# Patient Record
Sex: Female | Born: 1960 | Race: Black or African American | Hispanic: No | Marital: Married | Smoking: Never smoker
Health system: Southern US, Community
[De-identification: ages and names within clinical notes are randomized; demographics above are authoritative.]

## PROBLEM LIST (undated history)

## (undated) DIAGNOSIS — I10 Essential (primary) hypertension: Secondary | ICD-10-CM

---

## 2016-10-26 ENCOUNTER — Emergency Department
Admission: EM | Admit: 2016-10-26 | Discharge: 2016-10-26 | Disposition: A | Discharge status: Home / Self Care | Payer: Worker's Compensation | Attending: Emergency Medicine | Admitting: Emergency Medicine

## 2016-10-26 ENCOUNTER — Encounter: Payer: Self-pay | Admitting: Emergency Medicine

## 2016-10-26 DIAGNOSIS — S0993XA Unspecified injury of face, initial encounter: Secondary | ICD-10-CM | POA: Diagnosis present

## 2016-10-26 DIAGNOSIS — Y929 Unspecified place or not applicable: Secondary | ICD-10-CM | POA: Diagnosis not present

## 2016-10-26 DIAGNOSIS — S0083XA Contusion of other part of head, initial encounter: Secondary | ICD-10-CM | POA: Diagnosis not present

## 2016-10-26 DIAGNOSIS — Y939 Activity, unspecified: Secondary | ICD-10-CM | POA: Insufficient documentation

## 2016-10-26 DIAGNOSIS — W228XXA Striking against or struck by other objects, initial encounter: Secondary | ICD-10-CM | POA: Insufficient documentation

## 2016-10-26 DIAGNOSIS — Y990 Civilian activity done for income or pay: Secondary | ICD-10-CM | POA: Diagnosis not present

## 2016-10-26 MED ORDER — MELOXICAM 15 MG PO TABS: 15.0000 mg | ORAL_TABLET | Freq: Every day | ORAL | 0 refills | Status: DC

## 2016-10-26 NOTE — ED Provider Notes (Signed)
[facility] Emergency Department Provider Note  ____________________________________________  Time seen: Approximately 4:56 PM  I have reviewed the triage vital signs and the nursing notes.   HISTORY  Chief Complaint Facial Injury    HPI Chelsea Marshall is a 56 y.o. female who presents to emergency department complaining of right eye pain. Patient was at work when a cardboard 2 hit her in the right side of face. Patient reports that she initially had a "bump" to the lateral aspect of the right eye. She reports that this has gone down but she is starting to have some dark coloration underneath her right eye. She denies any pain at this time. She reports that she does wear glasses and they were knocked off of her face. She denies any blurred vision, double vision, visual changes, foreign body sensation to the eye. No headache, neck pain, chest pain, shortness breath, nausea vomiting. No medications prior to arrival.  Patient was sent by worker's comp to urgent care. Patient reports that after dealing with injury, all the paperwork, daily with boxes, but wait time in urgent care for blood pressure was elevated. She has no underlying history of hypertension. Urgent care sent patient over due to elevated blood pressure. Patient is visibly upset, worked up from her ordeal today. She denies any headache, visual changes, chest pain, shortness of breath.   History reviewed. No pertinent past medical history.  There are no active problems to display for this patient.   History reviewed. No pertinent surgical history.  Prior to Admission medications   Medication Sig Start Date End Date Taking? Authorizing Provider  meloxicam (MOBIC) 15 MG tablet Take 1 tablet (15 mg total) by mouth daily. 10/26/16   Hogan Hoobler, Delorise Royals, PA-C    Allergies Penicillins  History reviewed. No pertinent family history.  Social History Social History  Substance Use Topics  . Smoking  status: Never Smoker  . Smokeless tobacco: Never Used  . Alcohol use No     Review of Systems  Constitutional: No fever/chills Eyes: No visual changes. No foreign body sensation. Patient does wear glasses. ENT: No upper respiratory complaints. Cardiovascular: no chest pain. Respiratory: no cough. No SOB. Gastrointestinal: No abdominal pain.  No nausea, no vomiting.   Musculoskeletal: Negative for musculoskeletal pain. Skin: Negative for rash, abrasions, lacerations, ecchymosis. Neurological: Negative for headaches, focal weakness or numbness. 10-point ROS otherwise negative.  ____________________________________________   PHYSICAL EXAM:  VITAL SIGNS: ED Triage Vitals  Enc Vitals Group     BP 10/26/16 1607 (!) 197/103     Pulse Rate 10/26/16 1602 (!) 57     Resp 10/26/16 1602 16     Temp 10/26/16 1602 98.4 F (36.9 C)     Temp Source 10/26/16 1602 Oral     SpO2 10/26/16 1602 100 %     Weight 10/26/16 1602 158 lb (71.7 kg)     Height 10/26/16 1602 5\' 6"  (1.676 m)     Head Circumference --      Peak Flow --      Pain Score 10/26/16 1602 7     Pain Loc --      Pain Edu? --      Excl. in GC? --      Constitutional: Alert and oriented. Well appearing and in no acute distress. Eyes: Conjunctivae are normal. PERRL. EOMI. Head:small hematoma noted lateral to the right eye. This is mildly tender to palpation. No tenderness to palpation of the osseous structures of the orbits  or face. No palpable abnormality. No subcutaneous emphysema. Patient does have mild ecchymosis under the right eye. Extraocular motions intact. No serosanguineous fluid drainage from the ears or nares. No battle signs. ENT:      Ears:       Nose: No congestion/rhinnorhea.      Mouth/Throat: Mucous membranes are moist.  Neck: No stridor.  No cervical spine tenderness to palpation.  Cardiovascular: Normal rate, regular rhythm. Normal S1 and S2.  Good peripheral circulation. Respiratory: Normal respiratory  effort without tachypnea or retractions. Lungs CTAB. Good air entry to the bases with no decreased or absent breath sounds. Musculoskeletal: Full range of motion to all extremities. No gross deformities appreciated. Neurologic:  Normal speech and language. No gross focal neurologic deficits are appreciated.  Skin:  Skin is warm, dry and intact. No rash noted. Psychiatric: Mood and affect are normal. Speech and behavior are normal. Patient exhibits appropriate insight and judgement.   ____________________________________________   LABS (all labs ordered are listed, but only abnormal results are displayed)  Labs Reviewed - No data to display ____________________________________________  EKG   ____________________________________________  RADIOLOGY   No results found.  ____________________________________________    PROCEDURES  Procedure(s) performed:    Procedures    Medications - No data to display   ____________________________________________   INITIAL IMPRESSION / ASSESSMENT AND PLAN / ED COURSE  Pertinent labs & imaging results that were available during my care of the patient were reviewed by me and considered in my medical decision making (see chart for details).  Review of the [place] CSRS was performed in accordance of the [facility] prior to dispensing any controlled drugs.     Patient's diagnosis is consistent with facial contusion. At this time, exam was reassuring with no indication of underlying fractures or other somatic etiology. At this time, imaging is not warranted. She did have an elevated blood pressure and was sent from urgent care to the emergency department for evaluation of both injury as well as elevated blood pressure. Patient reports being extremely stressed from the injury, paperwork, dealing was appears at work. Patient was allowed to calm down, with reassuring diagnosis patient's vital signs did improve. No underlying history of hypertension. No  concerning symptoms warranting further investigation at this time. I will not start hypertensive medications. Patient is advised to follow-up with primary care and if she continues to have elevated blood pressures above normal limits, she will discuss with primary care potential for hypertension medications. Patient will be discharged home with prescriptions for anti-inflammatories for symptom control. Patient is to follow up with primary care as needed or otherwise directed. Patient is given ED precautions to return to the ED for any worsening or new symptoms.     ____________________________________________  FINAL CLINICAL IMPRESSION(S) / ED DIAGNOSES  Final diagnoses:  Contusion of face, initial encounter      NEW MEDICATIONS STARTED DURING THIS VISIT:  New Prescriptions   MELOXICAM (MOBIC) 15 MG TABLET    Take 1 tablet (15 mg total) by mouth daily.        This chart was dictated using voice recognition software/Dragon. Despite best efforts to proofread, errors can occur which can change the meaning. Any change was purely unintentional.    Racheal PatchesCuthriell, Lekesha Claw D, PA-C 10/26/16 1734    Phineas SemenGoodman, Graydon, MD 10/26/16 [phone]

## 2016-10-26 NOTE — ED Notes (Signed)

## 2016-10-26 NOTE — ED Notes (Signed)
Pt's BP discussed with Provider; provider said to provide instruction to pt to follow up with primary care provider regarding HTN; pt provided instructions to follow up with PCP; pt verbalized understanding.

## 2016-10-26 NOTE — ED Notes (Signed)
See triage note  Hit the right side of head at work  Small knot to right of head noted   No loc

## 2016-10-26 NOTE — ED Triage Notes (Signed)
Pt hit in side of head with cardboard tube. No LOC. Did not hit eye. This is worker comp; [employer].  Small knot to right side of face.  BP up in triage but comes down each time taken; pt agitated and upset about incident.  Did not want to come to ED.

## 2016-10-26 NOTE — ED Notes (Signed)
Urine drug screen performed. No issues.  

## 2016-11-22 ENCOUNTER — Observation Stay: Payer: Self-pay

## 2016-11-22 ENCOUNTER — Observation Stay
Admission: EM | Admit: 2016-11-22 | Discharge: 2016-11-23 | Disposition: A | Discharge status: Home / Self Care | Payer: Self-pay | Attending: Internal Medicine | Admitting: Internal Medicine

## 2016-11-22 ENCOUNTER — Encounter: Payer: Self-pay | Admitting: Emergency Medicine

## 2016-11-22 ENCOUNTER — Emergency Department: Payer: Self-pay

## 2016-11-22 ENCOUNTER — Observation Stay (HOSPITAL_BASED_OUTPATIENT_CLINIC_OR_DEPARTMENT_OTHER)
Admit: 2016-11-22 | Discharge: 2016-11-22 | Disposition: A | Discharge status: Home / Self Care | Payer: Self-pay | Attending: Internal Medicine | Admitting: Internal Medicine

## 2016-11-22 DIAGNOSIS — Z880 Allergy status to penicillin: Secondary | ICD-10-CM | POA: Insufficient documentation

## 2016-11-22 DIAGNOSIS — R29898 Other symptoms and signs involving the musculoskeletal system: Secondary | ICD-10-CM | POA: Diagnosis present

## 2016-11-22 DIAGNOSIS — I10 Essential (primary) hypertension: Secondary | ICD-10-CM | POA: Insufficient documentation

## 2016-11-22 DIAGNOSIS — H5711 Ocular pain, right eye: Secondary | ICD-10-CM | POA: Insufficient documentation

## 2016-11-22 DIAGNOSIS — R471 Dysarthria and anarthria: Secondary | ICD-10-CM | POA: Insufficient documentation

## 2016-11-22 DIAGNOSIS — I360 Nonrheumatic tricuspid (valve) stenosis: Secondary | ICD-10-CM

## 2016-11-22 DIAGNOSIS — G459 Transient cerebral ischemic attack, unspecified: Secondary | ICD-10-CM

## 2016-11-22 DIAGNOSIS — R531 Weakness: Principal | ICD-10-CM | POA: Insufficient documentation

## 2016-11-22 HISTORY — DX: Essential (primary) hypertension: I10

## 2016-11-22 LAB — COMPREHENSIVE METABOLIC PANEL
ALT: 18 U/L (ref 14–54)
ANION GAP: 8 (ref 5–15)
AST: 18 U/L (ref 15–41)
Albumin: 4.2 g/dL (ref 3.5–5.0)
Alkaline Phosphatase: 76 U/L (ref 38–126)
BILIRUBIN TOTAL: 0.8 mg/dL (ref 0.3–1.2)
BUN: 14 mg/dL (ref 6–20)
CO2: 28 mmol/L (ref 22–32)
Calcium: 9.8 mg/dL (ref 8.9–10.3)
Chloride: 104 mmol/L (ref 101–111)
Creatinine, Ser: 0.91 mg/dL (ref 0.44–1.00)
GFR calc Af Amer: >60 mL/min (ref >60)
Glucose, Bld: 104 mg/dL — ABNORMAL HIGH (ref 65–99)
POTASSIUM: 3.5 mmol/L (ref 3.5–5.1)
Sodium: 140 mmol/L (ref 135–145)
TOTAL PROTEIN: 7.6 g/dL (ref 6.5–8.1)

## 2016-11-22 LAB — PROTIME-INR
INR: 1.01
Prothrombin Time: 13.3 seconds (ref 11.4–15.2)

## 2016-11-22 LAB — DIFFERENTIAL
Basophils Absolute: 0 10*3/uL (ref 0–0.1)
Basophils Relative: 0 %
EOS ABS: 0.1 10*3/uL (ref 0–0.7)
EOS PCT: 1 %
LYMPHS ABS: 2 10*3/uL (ref 1.0–3.6)
Lymphocytes Relative: 26 %
Monocytes Absolute: 0.5 10*3/uL (ref 0.2–0.9)
Monocytes Relative: 7 %
Neutro Abs: 5 10*3/uL (ref 1.4–6.5)
Neutrophils Relative %: 66 %

## 2016-11-22 LAB — APTT: aPTT: 30 seconds (ref 24–36)

## 2016-11-22 LAB — CBC
HEMATOCRIT: 41.4 % (ref 35.0–47.0)
HEMOGLOBIN: 14.1 g/dL (ref 12.0–16.0)
MCH: 31.9 pg (ref 26.0–34.0)
MCHC: 34.1 g/dL (ref 32.0–36.0)
MCV: 93.4 fL (ref 80.0–100.0)
Platelets: 231 10*3/uL (ref 150–440)
RBC: 4.43 MIL/uL (ref 3.80–5.20)
RDW: 12.9 % (ref 11.5–14.5)
WBC: 7.6 10*3/uL (ref 3.6–11.0)

## 2016-11-22 LAB — TROPONIN I

## 2016-11-22 MED ORDER — STROKE: EARLY STAGES OF RECOVERY BOOK
Freq: Once | Status: AC
  Administered 2016-11-22: 21:00:00

## 2016-11-22 MED ORDER — ACETAMINOPHEN 650 MG RE SUPP: 650.0000 mg | RECTAL | Status: DC | PRN

## 2016-11-22 MED ORDER — LISINOPRIL 20 MG PO TABS
20.0000 mg | ORAL_TABLET | Freq: Every day | ORAL | Status: DC
  Administered 2016-11-22 – 2016-11-23 (×2): 20 mg via ORAL
  Filled 2016-11-22 (×2): qty 1

## 2016-11-22 MED ORDER — ENOXAPARIN SODIUM 40 MG/0.4ML ~~LOC~~ SOLN
40.0000 mg | SUBCUTANEOUS | Status: DC
  Administered 2016-11-22: 40 mg via SUBCUTANEOUS
  Filled 2016-11-22: qty 0.4

## 2016-11-22 MED ORDER — ASPIRIN 81 MG PO CHEW
324.0000 mg | CHEWABLE_TABLET | Freq: Once | ORAL | Status: AC
  Administered 2016-11-22: 324 mg via ORAL
  Filled 2016-11-22: qty 4

## 2016-11-22 MED ORDER — ACETAMINOPHEN 160 MG/5ML PO SOLN
650.0000 mg | ORAL | Status: DC | PRN
  Filled 2016-11-22: qty 20.3

## 2016-11-22 MED ORDER — ACETAMINOPHEN 325 MG PO TABS: 650.0000 mg | ORAL_TABLET | ORAL | Status: DC | PRN

## 2016-11-22 MED ORDER — HYDRALAZINE HCL 20 MG/ML IJ SOLN
20.0000 mg | Freq: Once | INTRAMUSCULAR | Status: AC
  Administered 2016-11-22: 20 mg via INTRAVENOUS

## 2016-11-22 MED ORDER — HYDRALAZINE HCL 20 MG/ML IJ SOLN
INTRAMUSCULAR | Status: AC
  Filled 2016-11-22: qty 1

## 2016-11-22 MED ORDER — HYDRALAZINE HCL 20 MG/ML IJ SOLN: 10.0000 mg | Freq: Four times a day (QID) | INTRAMUSCULAR | Status: DC | PRN

## 2016-11-22 NOTE — ED Provider Notes (Signed)
[facility] Emergency Department Provider Note  ____________________________________________   First MD Initiated Contact with Patient 11/22/16 1523     (approximate)  I have reviewed the triage vital signs and the nursing notes.   HISTORY  Chief Complaint Transient Ischemic Attack    HPI Chelsea Marshall is a 56 y.o. female who comes to the emergency department after having an episode of right arm weakness and dysarthria yesterday afternoon that lasted about 20 minutes. She was at rest at home when she began to try to talk and according to her spouse her words came out garbled and she was not making complete sentences. The patient said that at that timeshe became frustrated and understood that her words do not make any sense. She also said that at that time she attempted to move her right arm but it was completely paralyzed. Symptoms lasted about 20 minutes and then spontaneously resolved. They have not recurred since. She has a history of poorly controlled hypertension but no other past medical history. This has never happened before. She's never had a heart attack or stroke. Nothing seems to make the symptoms better or worse.   Past Medical History:  Diagnosis Date  . Hypertension     There are no active problems to display for this patient.   History reviewed. No pertinent surgical history.  Prior to Admission medications   Medication Sig Start Date End Date Taking? Authorizing Provider  lisinopril (PRINIVIL,ZESTRIL) 20 MG tablet Take 20 mg by mouth daily.   Yes [provider]  meloxicam (MOBIC) 15 MG tablet Take 1 tablet (15 mg total) by mouth daily. 10/26/16   Cuthriell, Delorise RoyalsJonathan D, PA-C    Allergies Penicillins  No family history on file.  Social History Social History  Substance Use Topics  . Smoking status: Never Smoker  . Smokeless tobacco: Never Used  . Alcohol use No    Review of Systems Constitutional: No  fever/chills Eyes: No visual changes. ENT: No sore throat. Cardiovascular: Denies chest pain. Respiratory: Denies shortness of breath. Gastrointestinal: No abdominal pain.  No nausea, no vomiting.  No diarrhea.  No constipation. Genitourinary: Negative for dysuria. Musculoskeletal: Negative for back pain. Skin: Negative for rash. Neurological: Positive for dysarthria and weakness   ____________________________________________   PHYSICAL EXAM:  VITAL SIGNS: ED Triage Vitals  Enc Vitals Group     BP 11/22/16 1419 (!) 207/112     Pulse Rate 11/22/16 1419 60     Resp 11/22/16 1419 18     Temp 11/22/16 1419 98.5 F (36.9 C)     Temp Source 11/22/16 1419 Oral     SpO2 11/22/16 1419 100 %     Weight 11/22/16 1420 162 lb (73.5 kg)     Height 11/22/16 1420 5\' 6"  (1.676 m)     Head Circumference --      Peak Flow --      Pain Score 11/22/16 1419 0     Pain Loc --      Pain Edu? --      Excl. in GC? --     Constitutional: Alert and oriented x 4 well appearing nontoxic no diaphoresis speaks in full, clear sentences Eyes: PERRL EOMI. Head: Atraumatic. Nose: No congestion/rhinnorhea. Mouth/Throat: No trismus Neck: No stridor.   Cardiovascular: Bradycardic rate, regular rhythm. Grossly normal heart sounds.  Good peripheral circulation. Respiratory: Normal respiratory effort.  No retractions. Lungs CTAB and moving good air Gastrointestinal: Soft nontender Musculoskeletal: No lower extremity edema  Neurologic:  Normal speech and language. No gross focal neurologic deficits are appreciated. Cranial nerves II through XII intact No pronator drift 55 grips biceps triceps hip flexion and extension plantar flexion dorsiflexion Sensation intact to light touch throughout Skin:  Skin is warm, dry and intact. No rash noted. Psychiatric: Mood and affect are normal. Speech and behavior are normal.    ____________________________________________   DIFFERENTIAL  Stroke, TIA, urinary  tract infection, metabolic derangement ____________________________________________   LABS (all labs ordered are listed, but only abnormal results are displayed)  Labs Reviewed  COMPREHENSIVE METABOLIC PANEL - Abnormal; Notable for the following:       Result Value   Glucose, Bld 104 (*)    All other components within normal limits  PROTIME-INR  APTT  CBC  DIFFERENTIAL  TROPONIN I    No signs of ischemia labs within normal limits  EKG  ED ECG REPORT I, Merrily Brittle, the attending physician, personally viewed and interpreted this ECG.  Date: 11/22/2016 Rate: 60 Rhythm: sinus rhythm QRS Axis: normal Intervals: normal ST/T Wave abnormalities: normal Conduction Disturbances: none Narrative Interpretation: unremarkable  ____________________________________________  RADIOLOGY  Head CT with no acute disease ____________________________________________   PROCEDURES  Procedure(s) performed: no  Procedures  Critical Care performed: no  Observation: no ____________________________________________   INITIAL IMPRESSION / ASSESSMENT AND PLAN / ED COURSE  Pertinent labs & imaging results that were available during my care of the patient were reviewed by me and considered in my medical decision making (see chart for details).  On arrival the patient is well-appearing although quite hypertensive. She is completely normal neurological examination. Her story is consistent with a significant transient ischemic attack. At this point I will give her an aspirin and she will require inpatient admission for MRI echocardiogram and stroke workup. I have a call out to Dr. Thad Ranger now.      ____________________________________________   FINAL CLINICAL IMPRESSION(S) / ED DIAGNOSES  Final diagnoses:  Transient cerebral ischemia, unspecified type      NEW MEDICATIONS STARTED DURING THIS VISIT:  New Prescriptions   No medications on file     Note:  This document  was prepared using Dragon voice recognition software and may include unintentional dictation errors.     Merrily Brittle, MD 11/22/16 [phone]

## 2016-11-22 NOTE — ED Triage Notes (Signed)
Patient presents to the ED because she had an episode of difficulty speaking and confusion x 20 minutes yesterday with left sided facial droop.  Symptoms have all resolved.  Patient's grip strength is strong and equal.  Patient is alert and oriented x 4 and denies pain.  Patient is very hypertensive and reports arguing with family regarding coming to the ED.  Patient denies dizziness and blurry vision and headache.

## 2016-11-22 NOTE — H&P (Addendum)
[facility] [place] [facility]   PATIENT NAME: Chelsea Marshall    MR#:  [id number]  DATE OF BIRTH:  [birth year]  DATE OF ADMISSION:  11/22/2016  PRIMARY CARE PHYSICIAN: [facility]   REQUESTING/REFERRING PHYSICIAN: Rifenbark  CHIEF COMPLAINT:TIA   Chief Complaint  Patient presents with  . Transient Ischemic Attack    HISTORY OF PRESENT ILLNESS:  Chelsea Marshall  is a 56 y.o. female with a known history of  Essential hypertension came in because of sudden onset of right weakness, dysarthria yesterday evening. Symptoms lasted for about 15-20 minutes after that symptoms of weakness and is also resolved. Brought into the hospital today for evaluation of stroke but she has no further right-sided weakness or dysarthria. Patient told me that she could not move the right arm at all yesterday and felt like right him paralyzed completely yesterday evening. Patient had history of  Blunt eye trauma at work on May 10 and was seen in the emergency room,. When she is better with respect to her right eye pain at this time.  PAST MEDICAL HISTORY:   Past Medical History:  Diagnosis Date  . Hypertension     PAST SURGICAL HISTOIRY:  History reviewed. No pertinent surgical history.  SOCIAL HISTORY:   Social History  Substance Use Topics  . Smoking status: Never Smoker  . Smokeless tobacco: Never Used  . Alcohol use No    FAMILY HISTORY:  No family history on file.  DRUG ALLERGIES:   Allergies  Allergen Reactions  . Erythromycin Shortness Of Breath  . Penicillins Anaphylaxis    Has patient had a PCN reaction causing immediate rash, facial/tongue/throat swelling, SOB or lightheadedness with hypotension: Yes Has patient had a PCN reaction causing severe rash involving mucus membranes or skin necrosis: Yes Has patient had a PCN reaction that required hospitalization: No Has patient had a PCN reaction occurring within the last 10 years:  Yes If all of the above answers are "NO", then may proceed with Cephalosporin use.     REVIEW OF SYSTEMS:  CONSTITUTIONAL: No fever, fatigue or weakness.  EYES: No blurred or double vision.  EARS, NOSE, AND THROAT: No tinnitus or ear pain.  RESPIRATORY: No cough, shortness of breath, wheezing or hemoptysis.  CARDIOVASCULAR: No chest pain, orthopnea, edema.  GASTROINTESTINAL: No nausea, vomiting, diarrhea or abdominal pain.  GENITOURINARY: No dysuria, hematuria.  ENDOCRINE: No polyuria, nocturia,  HEMATOLOGY: No anemia, easy bruising or bleeding SKIN: No rash or lesion. MUSCULOSKELETAL: No joint pain or arthritis.   NEUROLOGIC: No tingling, numbness, weaknessNow, patient had right hand paralysis she told me that she could not use the right hand at all yesterday for 15-20 minutes.Marland Kitchen  PSYCHIATRY: No anxiety or depression.   MEDICATIONS AT HOME:   Prior to Admission medications   Medication Sig Start Date End Date Taking? Authorizing Provider  lisinopril (PRINIVIL,ZESTRIL) 20 MG tablet Take 20 mg by mouth daily.   Yes [provider]  multivitamin (ONE-A-DAY MEN'S) TABS tablet Take 1 tablet by mouth daily.   Yes [provider]  vitamin B-12 (CYANOCOBALAMIN) 50 MCG tablet Take 50 mcg by mouth daily.   Yes [provider]  vitamin C (ASCORBIC ACID) 500 MG tablet Take 500 mg by mouth daily.   Yes [provider]  meloxicam (MOBIC) 15 MG tablet Take 1 tablet (15 mg total) by mouth daily. Patient not taking: Reported on 11/22/2016 10/26/16   Cuthriell, Delorise Royals, PA-C  VITAL SIGNS:  Blood pressure (!) 151/92, pulse 73, temperature 98.5 F (36.9 C), temperature source Oral, resp. rate 14, height 5\' 6"  (1.676 m), weight 73.5 kg (162 lb), last menstrual period 07/26/2016, SpO2 100 %.  PHYSICAL EXAMINATION:  GENERAL:  56 y.o.-year-old patient lying in the bed with no acute distress.  EYES: Pupils equal, round, reactive to light and accommodation. No  scleral icterus. Extraocular muscles intact.  HEENT: Head atraumatic, normocephalic. Oropharynx and nasopharynx clear.  NECK:  Supple, no jugular venous distention. No thyroid enlargement, no tenderness.  LUNGS: Normal breath sounds bilaterally, no wheezing, rales,rhonchi or crepitation. No use of accessory muscles of respiration.  CARDIOVASCULAR: S1, S2 normal. No murmurs, rubs, or gallops.  ABDOMEN: Soft, nontender, nondistended. Bowel sounds present. No organomegaly or mass.  EXTREMITIES: No pedal edema, cyanosis, or clubbing.  NEUROLOGIC: Cranial nerves II through XII are intact. Muscle strength 5/5 in all extremities. Sensation intact. Gait not checked.  PSYCHIATRIC: The patient is alert and oriented x 3.  SKIN: No obvious rash, lesion, or ulcer.   LABORATORY PANEL:   CBC  Recent Labs Lab 11/22/16 1426  WBC 7.6  HGB 14.1  HCT 41.4  PLT 231   ------------------------------------------------------------------------------------------------------------------  Chemistries   Recent Labs Lab 11/22/16 1426  NA 140  K 3.5  CL 104  CO2 28  GLUCOSE 104*  BUN 14  CREATININE 0.91  CALCIUM 9.8  AST 18  ALT 18  ALKPHOS 76  BILITOT 0.8   ------------------------------------------------------------------------------------------------------------------  Cardiac Enzymes  Recent Labs Lab 11/22/16 1426  TROPONINI <0.03   ------------------------------------------------------------------------------------------------------------------  RADIOLOGY:  Ct Head Wo Contrast  Result Date: 11/22/2016 CLINICAL DATA:  Confusion and difficulty speaking EXAM: CT HEAD WITHOUT CONTRAST TECHNIQUE: Contiguous axial images were obtained from the base of the skull through the vertex without intravenous contrast. COMPARISON:  None. FINDINGS: Brain: No evidence of acute infarction, hemorrhage, hydrocephalus, extra-axial collection or mass lesion/mass effect. Vascular: No hyperdense vessel or  unexpected calcification. Skull: Normal. Negative for fracture or focal lesion. Sinuses/Orbits: No acute finding. Other: None. IMPRESSION: No acute intracranial abnormality noted. Electronically Signed   By: Alcide Clever M.D.   On: 11/22/2016 15:01   US Carotid Bilateral (at [facility] Only)  Result Date: 11/22/2016 CLINICAL DATA:  Right-sided weakness.  Hypertension. EXAM: BILATERAL CAROTID DUPLEX ULTRASOUND TECHNIQUE: Wallace Cullens scale imaging, color Doppler and duplex ultrasound was performed of bilateral carotid and vertebral arteries in the neck. COMPARISON:  None. TECHNIQUE: Quantification of carotid stenosis is based on velocity parameters that correlate the residual internal carotid diameter with NASCET-based stenosis levels, using the diameter of the distal internal carotid lumen as the denominator for stenosis measurement. The following velocity measurements were obtained: PEAK SYSTOLIC/END DIASTOLIC RIGHT ICA:                     87/34cm/sec CCA:                     125/22cm/sec SYSTOLIC ICA/CCA RATIO:  0.7 DIASTOLIC ICA/CCA RATIO: 1.6 ECA:                     120cm/sec LEFT ICA:                     125/41cm/sec CCA:                     136/22cm/sec SYSTOLIC ICA/CCA RATIO:  0.9 DIASTOLIC ICA/CCA RATIO: 1.8 ECA:  134cm/sec FINDINGS: RIGHT CAROTID ARTERY: No significant plaque accumulation or stenosis. Normal waveforms and color Doppler signal. RIGHT VERTEBRAL ARTERY:  Normal flow direction and waveform. LEFT CAROTID ARTERY: No significant plaque or stenosis. Normal waveforms and color Doppler signal. LEFT VERTEBRAL ARTERY: Normal flow direction and waveform. IMPRESSION: Negative Electronically Signed   By: Corlis Leak  Hassell M.D.   On: 11/22/2016 17:35    EKG:   Orders placed or performed during the hospital encounter of 11/22/16  . ED EKG  . ED EKG   Normal sinus rhythm with no ST-T changes. IMPRESSION AND PLAN:   1 sudden  Onset of  right-sided weakness with dysarthria, symptoms  concerning for TIA: Admit to stroke unit, check MRI of the brain, ultrasound of carotids, echocardiogram, obtain neurology consult. Aspirin, check fasting lipids, patient may need monitoring if stroke workup is negative.allow permissive hypertensionWith BP: goal 180/90.  2.Malignant HTN;BP 207/112; his IV hydralazine 20 mg every 6 hours for SBP more than 180.   All the records are reviewed and case discussed with ED provider. Management plans discussed with the patient, family and they are in agreement.  CODE STATUS:full  TOTAL TIME TAKING CARE OF THIS PATIENT:55 minutes.    Katha HammingKONIDENA,Robel Wuertz M.D on 11/22/2016 at 6:00 PM  Between 7am to 6pm - Pager - [phone]  After 6pm go to [email or web address] - password [email or web address] [facility]  [facility]  Office  [phone]  CC: Primary care physician; [facility]  Note: This dictation was prepared with Dragon dictation along with smaller phrase technology. Any transcriptional errors that result from this process are unintentional.

## 2016-11-22 NOTE — ED Notes (Signed)
Patient taken to MRI by ED tech

## 2016-11-22 NOTE — ED Notes (Signed)
Patient taken to ultrasound.

## 2016-11-22 NOTE — ED Notes (Signed)
Patient remains in MRI at this time. MRI tech was contacted by this Clinical research associatewriter and given instructions to call when exam is finished and will then take the patient to the floor.

## 2016-11-23 LAB — LIPID PANEL
CHOL/HDL RATIO: 2.6 ratio
Cholesterol: 160 mg/dL (ref 0–200)
HDL: 62 mg/dL (ref >40)
LDL CALC: 90 mg/dL (ref 0–99)
Triglycerides: 42 mg/dL (ref <150)
VLDL: 8 mg/dL (ref 0–40)

## 2016-11-23 LAB — ECHOCARDIOGRAM COMPLETE
Height: 66 in
Weight: 2592 oz

## 2016-11-23 MED ORDER — AMLODIPINE BESYLATE 5 MG PO TABS
2.5000 mg | ORAL_TABLET | Freq: Every day | ORAL | Status: DC
  Administered 2016-11-23: 11:00:00 2.5 mg via ORAL
  Filled 2016-11-23: qty 1

## 2016-11-23 MED ORDER — ASPIRIN EC 81 MG PO TBEC: 81.0000 mg | DELAYED_RELEASE_TABLET | Freq: Every day | ORAL | 0 refills | Status: AC

## 2016-11-23 MED ORDER — ATORVASTATIN CALCIUM 40 MG PO TABS: 40.0000 mg | ORAL_TABLET | Freq: Every day | ORAL | 0 refills | Status: AC

## 2016-11-23 MED ORDER — AMLODIPINE BESYLATE 2.5 MG PO TABS: 2.5000 mg | ORAL_TABLET | Freq: Every day | ORAL | 0 refills | Status: AC

## 2016-11-23 NOTE — Evaluation (Signed)
Physical Therapy Evaluation Patient Details Name: Chelsea Marshall MRN: [id number] DOB: Aug 12, 1960 Today's Date: 11/23/2016   History of Present Illness  Chelsea Marshall  is a 56 y.o. female with a known history of essential hypertension came in because of sudden onset of right weakness and dysarthria. Symptoms lasted for about 15-20 minutes and after that symptoms of weakness resolved. Brought into the hospital for evaluation of stroke but she has no further right-sided weakness or dysarthria. Patient had history of blunt eye trauma at work on May 10 and was seen in the emergency room. She is better with respect to her right eye pain at this time. At time of PT evaluation pt reports complete resolution of her symptoms.   Clinical Impression  No deficits identified during PT evaluation. Full sensation and strength to bilateral UE/LE. Negative pronator drift, RAM, finger to nose, and heel to shin. Facial strength intact, tongue and uvula midline. Pt is independent with bed mobility, transfers, and ambulation without deficits identified. She is able to ambulate in hallways without deficits identified. Negative Rhomberg and single leg balance >10 seconds. Pt educated about FAST acronym regarding signs/symptoms of stroke. Discussed importance of timing for her to return to ED if symptoms recur. No deficits identified and no further PT needs. Will complete order.     Follow Up Recommendations No PT follow up    Equipment Recommendations  None recommended by PT    Recommendations for Other Services       Precautions / Restrictions Precautions Precautions: Fall Restrictions Weight Bearing Restrictions: No      Mobility  Bed Mobility Overal bed mobility: Independent             General bed mobility comments: No deficits noted. Good speed/sequencing  Transfers Overall transfer level: Independent Equipment used: None             General transfer comment: Good speed, sequencing,  and stability. No deficits noted  Ambulation/Gait Ambulation/Gait assistance: Independent Ambulation Distance (Feet): 150 Feet Assistive device: None Gait Pattern/deviations: WFL(Within Functional Limits) Gait velocity: WNL Gait velocity interpretation: at or above normal speed for age/gender General Gait Details: Pt demonstrates speed, sequencing, and stability which is WNL. No deficits identified. HR WNL throughout ambulation  Stairs            Wheelchair Mobility    Modified Rankin (Stroke Patients Only)       Balance Overall balance assessment: Independent                                           Pertinent Vitals/Pain Pain Assessment: No/denies pain    Home Living Family/patient expects to be discharged to:: Private residence Living Arrangements: Spouse/significant other Available Help at Discharge: Family Type of Home: Apartment Home Access: Stairs to enter Entrance Stairs-Rails: Right;Left;Can reach both Secretary/administratorntrance Stairs-Number of Steps: 15 Home Layout: One level Home Equipment: None      Prior Function Level of Independence: Independent         Comments: Independent with ADLs/IADLs. Ambulates without asssistive device full community distances. Drives. Works full time     Higher education careers adviserHand Dominance   Dominant Hand: Right (Ambidextrous)    Extremity/Trunk Assessment   Upper Extremity Assessment Upper Extremity Assessment: Overall WFL for tasks assessed    Lower Extremity Assessment Lower Extremity Assessment: Overall WFL for tasks assessed       Communication  Communication: No difficulties  Cognition Arousal/Alertness: Awake/alert Behavior During Therapy: WFL for tasks assessed/performed Overall Cognitive Status: Within Functional Limits for tasks assessed                                        General Comments      Exercises     Assessment/Plan    PT Assessment Patent does not need any further PT  services  PT Problem List         PT Treatment Interventions      PT Goals (Current goals can be found in the Care Plan section)  Acute Rehab PT Goals PT Goal Formulation: All assessment and education complete, DC therapy    Frequency     Barriers to discharge        Co-evaluation               AM-PAC PT "6 Clicks" Daily Activity  Outcome Measure Difficulty turning over in bed (including adjusting bedclothes, sheets and blankets)?: None Difficulty moving from lying on back to sitting on the side of the bed? : None Difficulty sitting down on and standing up from a chair with arms (e.g., wheelchair, bedside commode, etc,.)?: None Help needed moving to and from a bed to chair (including a wheelchair)?: None Help needed walking in hospital room?: None Help needed climbing 3-5 steps with a railing? : None 6 Click Score: 24    End of Session   Activity Tolerance: Patient tolerated treatment well Patient left: in bed Nurse Communication: Mobility status PT Visit Diagnosis: Muscle weakness (generalized) (M62.81)    Time: 1610-9604 PT Time Calculation (min) (ACUTE ONLY): 10 min   Charges:   PT Evaluation $PT Eval Low Complexity: 1 Procedure     PT G Codes:   PT G-Codes **NOT FOR INPATIENT CLASS** Functional Assessment Tool Used: AM-PAC 6 Clicks Basic Mobility Functional Limitation: Mobility: Walking and moving around Mobility: Walking and Moving Around Current Status (V4098): 0 percent impaired, limited or restricted Mobility: Walking and Moving Around Goal Status (J1914): 0 percent impaired, limited or restricted Mobility: Walking and Moving Around Discharge Status (N8295): 0 percent impaired, limited or restricted    Lynnea Maizes PT, DPT    Chelsea Marshall 11/23/2016, 10:57 AM

## 2016-11-23 NOTE — Progress Notes (Signed)
SLP Cancellation Note  Patient Details Name: Chelsea Marshall MRN: [id number] DOB: October 08, 1960   Cancelled treatment:       Reason Eval/Treat Not Completed: SLP screened, no needs identified, will sign off (chart reviewed; NSG consulted; met w/ pt/family). Pt denied any difficulty swallowing and is currently on a regular diet; tolerates swallowing pills w/ water per NSG. Pt A/O, conversed at conversational level w/out deficits noted; pt and family denied any speech-language deficits this morning.  No further skilled ST services indicated as pt appears at her baseline. Pt agreed. NSG to reconsult if any change in status.     Orinda Kenner, MS, CCC-SLP Dietrich Ke 11/23/2016, 9:06 AM

## 2016-11-23 NOTE — Progress Notes (Signed)
[facility]         [place]   11/23/2016  Patient: Chelsea Marshall   Date of Birth:  1960/09/23  Date of admission:  11/22/2016  Date of Discharge  11/23/2016    To Whom it May Concern:   Chelsea Marshall  may return to work on 11/25/2016.  PHYSICAL ACTIVITY:  Full  If you have any questions or concerns, please don't hesitate to call.  Sincerely,   Milagros LollSudini, Brittany Osier R M.D Office : [phone]   .

## 2016-11-23 NOTE — Progress Notes (Signed)
OT Cancellation Note  Patient Details Name: Ivor ReiningJacqueline Azbell MRN: [id number] DOB: 06/26/1960   Cancelled Treatment:     OT orders received and patient screened.  Patient appears back to baseline and denies any current deficits.  She has been up, gone to the bathroom and is independent with basic self care tasks.  Will complete order, if further needs arise please reconsult OT services.  Aisea Bouldin T Dolores Ewing, OTR/L, CLT  Rever Pichette 11/23/2016, 9:33 AM

## 2016-11-23 NOTE — Progress Notes (Signed)
Dr. Elpidio AnisSudini notified of hypertension.  Orders placed by MD.

## 2016-11-23 NOTE — Progress Notes (Signed)
Discharge instructions given with verbalized understanding.  Patient refused wheelchair.  Patient ambulated out to visitors entrance to be taken home via personal vehicle by family.

## 2016-11-23 NOTE — Progress Notes (Signed)
Dr. Elpidio AnisSudini requested Neurology consult be discontinued.

## 2016-11-23 NOTE — Progress Notes (Signed)
Pt showing no signs of neurological deficits.  NIH is 0.  BP slightly elevated, but otherwise vitals stable.  Pt states she doesn't want to stay longer than this one night if possible.

## 2016-11-24 LAB — HIV ANTIBODY (ROUTINE TESTING W REFLEX): HIV Screen 4th Generation wRfx: NONREACTIVE

## 2016-11-24 LAB — HEMOGLOBIN A1C
Hgb A1c MFr Bld: 5.5 % (ref 4.8–5.6)
Mean Plasma Glucose: 111 mg/dL

## 2016-11-25 NOTE — Discharge Summary (Signed)
[facility] [place] [facility]   PATIENT NAME: Chelsea Marshall    MR#:  [id number]  DATE OF BIRTH:  07-22-1960  DATE OF ADMISSION:  11/22/2016 ADMITTING PHYSICIAN: Katha Hamming, MD  DATE OF DISCHARGE: 11/23/2016 11:14 AM  PRIMARY CARE PHYSICIAN: [facility]   ADMISSION DIAGNOSIS:  Right sided weakness [R53.1] Transient cerebral ischemia, unspecified type [G45.9]  DISCHARGE DIAGNOSIS:  Active Problems:   Weakness of right hand   SECONDARY DIAGNOSIS:   Past Medical History:  Diagnosis Date  . Hypertension      ADMITTING HISTORY  HISTORY OF PRESENT ILLNESS:  Chelsea Marshall  is a 56 y.o. female with a known history of  Essential hypertension came in because of sudden onset of right weakness, dysarthria yesterday evening. Symptoms lasted for about 15-20 minutes after that symptoms of weakness and is also resolved. Brought into the hospital today for evaluation of stroke but she has no further right-sided weakness or dysarthria. Patient told me that she could not move the right arm at all yesterday and felt like right him paralyzed completely yesterday evening. Patient had history of  Blunt eye trauma at work on May 10 and was seen in the emergency room,. When she is better with respect to her right eye pain at this time.   HOSPITAL COURSE:   * TIA Patient was admitted to medical floor with telemetry monitoring. Neuro checks conducted every 4 hours. Started on aspirin and statin. Neurology consulted. MRI checked and showed no acute abnormalities. Echocardiogram and carotid Doppler showed nothing acute. No arrhythmias on telemetry. Patient's symptoms completely resolved. Was assessed for physical therapy and speech therapy. Patient is being discharged home in stable condition to follow-up with primary care physician and urology as outpatient. She will be on aspirin and statins.  * Uncontrolled hypertension. Patient was recently started  on lisinopril. She has taken this for close to 2 weeks and blood pressure continues to be elevated. Start Norvasc.  Patient stable for discharge.  CONSULTS OBTAINED:  Treatment Team:  Thana Farr, MD  DRUG ALLERGIES:   Allergies  Allergen Reactions  . Erythromycin Shortness Of Breath  . Penicillins Anaphylaxis    Has patient had a PCN reaction causing immediate rash, facial/tongue/throat swelling, SOB or lightheadedness with hypotension: Yes Has patient had a PCN reaction causing severe rash involving mucus membranes or skin necrosis: Yes Has patient had a PCN reaction that required hospitalization: No Has patient had a PCN reaction occurring within the last 10 years: Yes If all of the above answers are "NO", then may proceed with Cephalosporin use.     DISCHARGE MEDICATIONS:   Discharge Medication List as of 11/23/2016 10:59 AM    START taking these medications   Details  amLODipine (NORVASC) 2.5 MG tablet Take 1 tablet (2.5 mg total) by mouth daily., Starting Thu 11/23/2016, Normal    aspirin EC 81 MG tablet Take 1 tablet (81 mg total) by mouth daily., Starting Thu 11/23/2016, Normal    atorvastatin (LIPITOR) 40 MG tablet Take 1 tablet (40 mg total) by mouth daily., Starting Thu 11/23/2016, Until Fri 11/23/2017, Normal      CONTINUE these medications which have NOT CHANGED   Details  lisinopril (PRINIVIL,ZESTRIL) 20 MG tablet Take 20 mg by mouth daily., Historical Med    multivitamin (ONE-A-DAY MEN'S) TABS tablet Take 1 tablet by mouth daily., Historical Med    vitamin B-12 (CYANOCOBALAMIN) 50 MCG tablet Take 50 mcg by mouth daily., Historical Med  vitamin C (ASCORBIC ACID) 500 MG tablet Take 500 mg by mouth daily., Historical Med      STOP taking these medications     meloxicam (MOBIC) 15 MG tablet         Today   VITAL SIGNS:  Blood pressure (!) 179/67, pulse 64, temperature 97.7 F (36.5 C), resp. rate 16, height 5\' 6"  (1.676 m), weight 73.5 kg (162 lb),  last menstrual period 07/26/2016, SpO2 100 %.  I/O:  No intake or output data in the 24 hours ending 11/25/16 1419  PHYSICAL EXAMINATION:  Physical Exam  GENERAL:  56 y.o.-year-old patient lying in the bed with no acute distress.  LUNGS: Normal breath sounds bilaterally, no wheezing, rales,rhonchi or crepitation. No use of accessory muscles of respiration.  CARDIOVASCULAR: S1, S2 normal. No murmurs, rubs, or gallops.  ABDOMEN: Soft, non-tender, non-distended. Bowel sounds present. No organomegaly or mass.  NEUROLOGIC: Moves all 4 extremities. PSYCHIATRIC: The patient is alert and oriented x 3.  SKIN: No obvious rash, lesion, or ulcer.   DATA REVIEW:   CBC  Recent Labs Lab 11/22/16 1426  WBC 7.6  HGB 14.1  HCT 41.4  PLT 231    Chemistries   Recent Labs Lab 11/22/16 1426  NA 140  K 3.5  CL 104  CO2 28  GLUCOSE 104*  BUN 14  CREATININE 0.91  CALCIUM 9.8  AST 18  ALT 18  ALKPHOS 76  BILITOT 0.8    Cardiac Enzymes  Recent Labs Lab 11/22/16 1426  TROPONINI <0.03    Microbiology Results  No results found for this or any previous visit.  RADIOLOGY:  No results found.  Follow up with PCP in 1 week.  Management plans discussed with the patient, family and they are in agreement.  CODE STATUS:  Code Status History    Date Active Date Inactive Code Status Order ID Comments User Context   11/22/2016  4:11 PM 11/23/2016  2:19 PM Full Code [id number]  Katha HammingKonidena, Snehalatha, MD ED      TOTAL TIME TAKING CARE OF THIS PATIENT ON DAY OF DISCHARGE: more than 30 minutes.   Milagros LollSudini, Sanders Manninen R M.D on 11/25/2016 at 2:19 PM  Between 7am to 6pm - Pager - [phone]  After 6pm go to [email or web address] - password [email or web address]  [facility]  Office  [phone]  CC: Primary care physician; [facility]  Note: This dictation was prepared with Dragon dictation along with smaller phrase technology. Any transcriptional errors that result from  this process are unintentional.

## 2018-10-03 IMAGING — MR MR HEAD W/O CM
11 series · 42 of 48 positions shown · non-contrast
Comparison: Head CT 11/22/2016

CLINICAL DATA: Right-sided weakness and dysarthria

EXAM:
MRI HEAD WITHOUT CONTRAST
MRA HEAD WITHOUT CONTRAST
TECHNIQUE: Multiplanar, multiecho pulse sequences of the brain and surrounding
structures were obtained without intravenous contrast. Angiographic
images of the head were obtained using MRA technique without
contrast.

[Series 2: T1 · sagittal · 5.0mm · 0.45mm/px · 1 of 25 slices shown (1 of 2)]
[im 1/25]
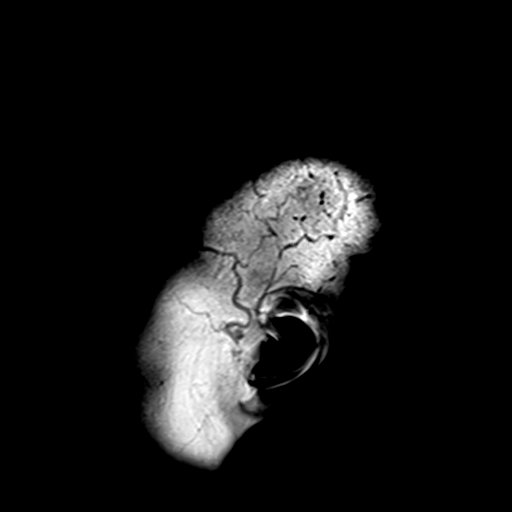

[Series 4: DWI · axial · 3.0mm · 1.80mm/px · z∈[-81,+80]mm · 4 of 55 slices shown (1 of 4)]
[im 1/55]
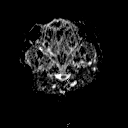
[im 19/55]
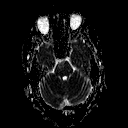
[im 37/55]
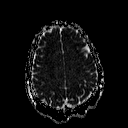
[im 55/55]
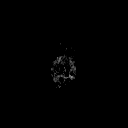

[Series 6: DWI · coronal · 3.0mm · 1.80mm/px · 3 of 45 slices shown (2 of 4)]
[im 1/45]
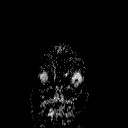
[im 23/45]
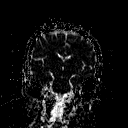
[im 45/45]
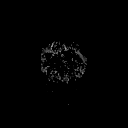

[Series 7: TOF · axial · non-contrast · 0.7mm · 0.37mm/px · z∈[-88,+11]mm · 8 of 143 slices shown]
[im 1/143]
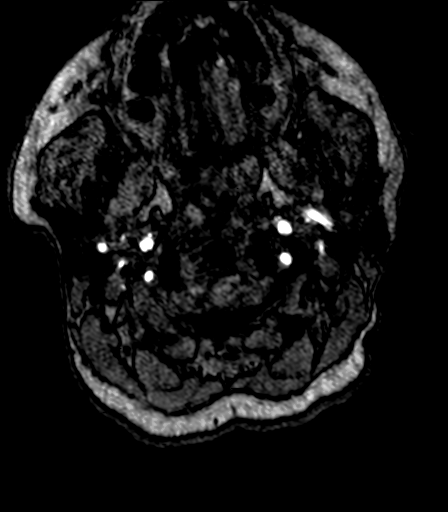
[im 16/143]
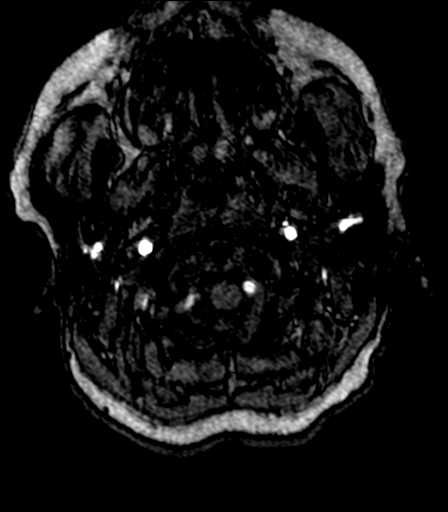
[im 48/143]
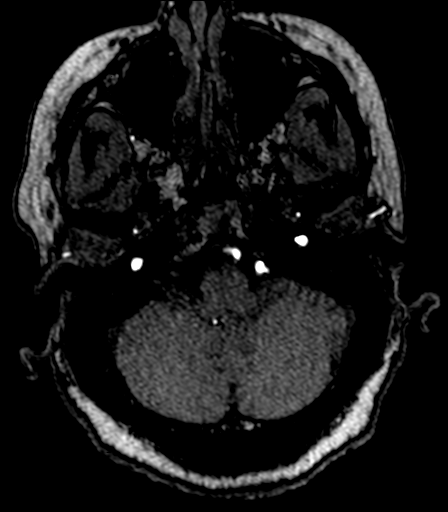
[im 64/143]
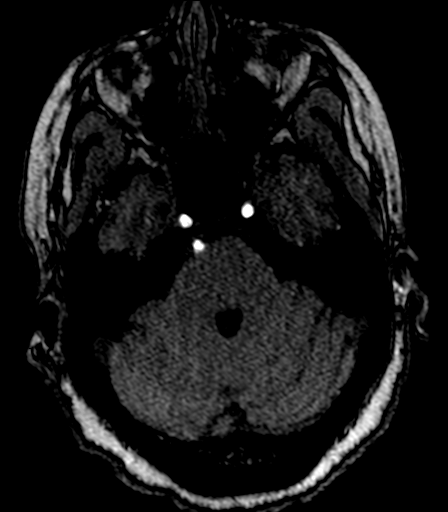
[im 79/143]
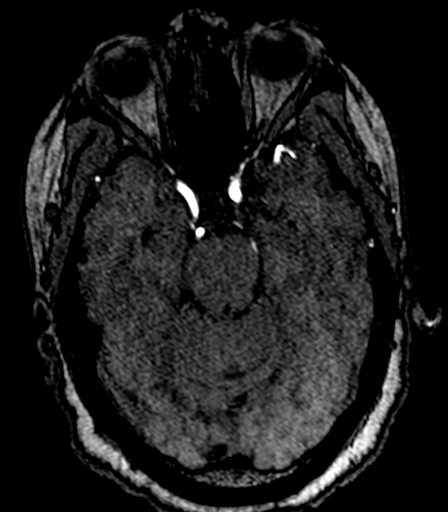
[im 95/143]
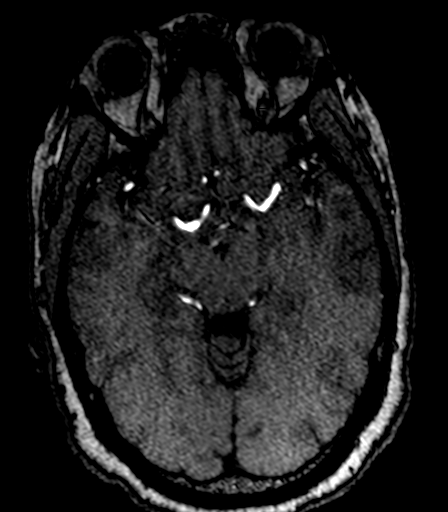
[im 127/143]
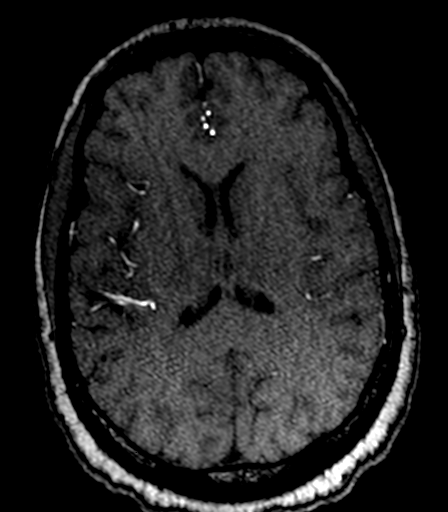
[im 143/143]
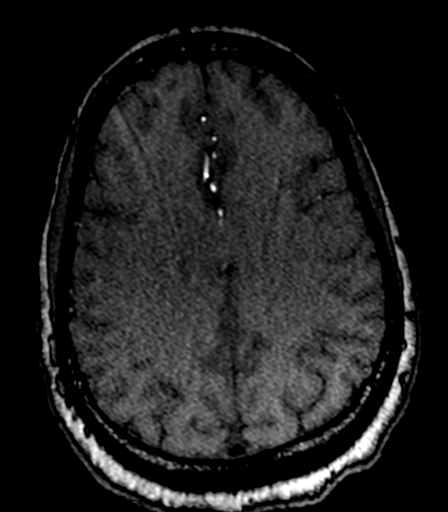

[Series 12: T2 · axial · 5.0mm · 0.60mm/px · z∈[-79,+76]mm · 2 of 25 slices shown (1 of 3)]
[im 1/25]
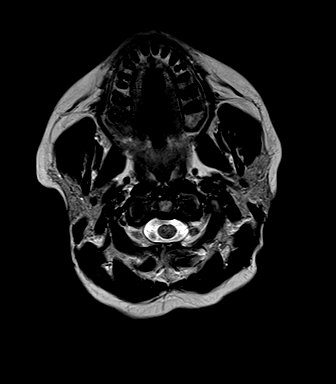
[im 25/25]
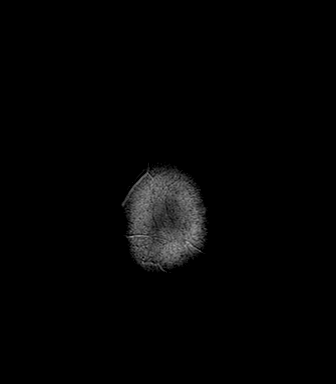

[Series 13: FLAIR · axial · 3.0mm · 0.45mm/px · z∈[-79,+76]mm · 4 of 53 slices shown]
[im 1/53]
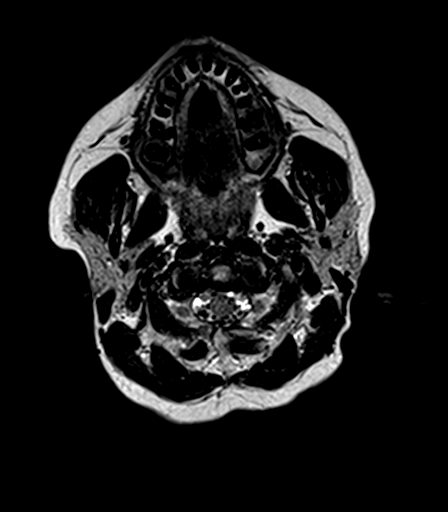
[im 18/53]
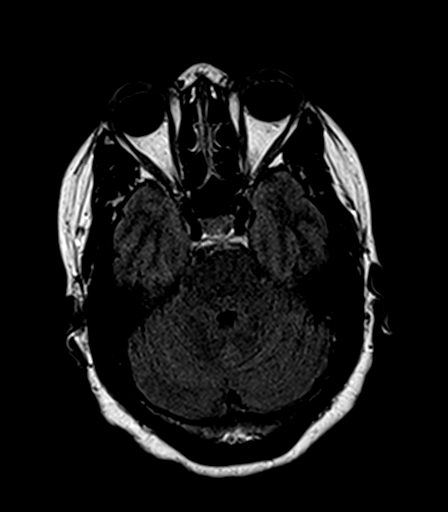
[im 35/53]
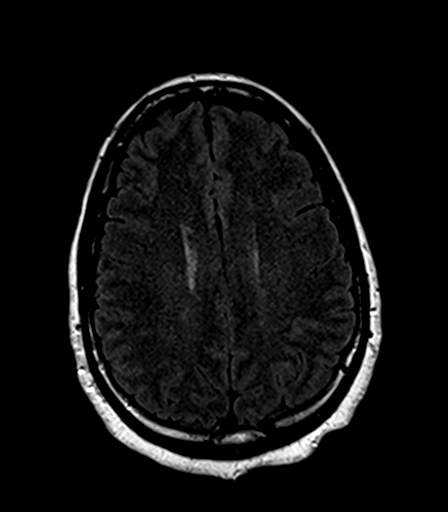
[im 53/53]
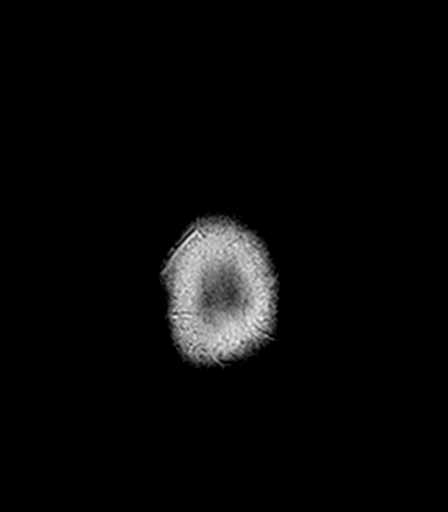

[Series 14: T2 · axial · 5.0mm · 0.45mm/px · z∈[-79,+76]mm · 2 of 25 slices shown (2 of 3)]
[im 1/25]
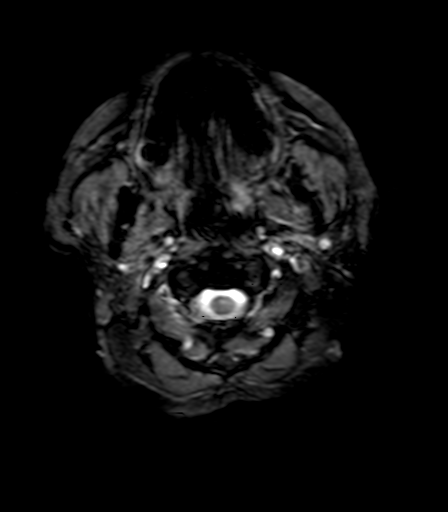
[im 25/25]
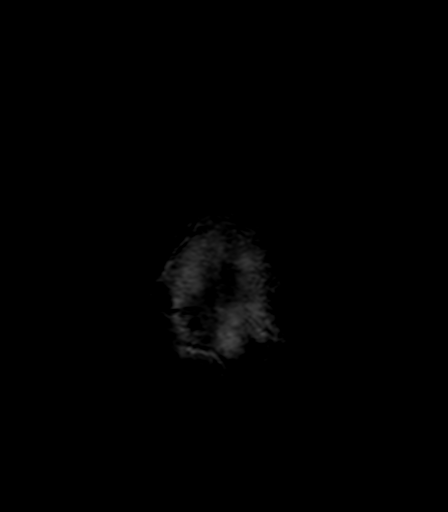

[Series 15: T1 · axial · 1.0mm · 1.00mm/px · z∈[-89,+85]mm · 9 of 176 slices shown (2 of 2)]
[im 1/176]
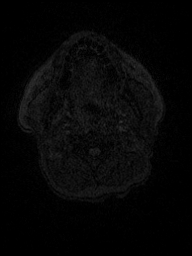
[im 15/176]
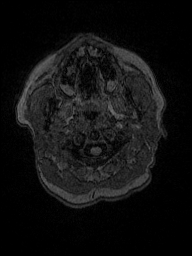
[im 30/176]
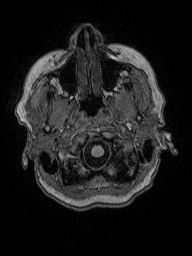
[im 59/176]
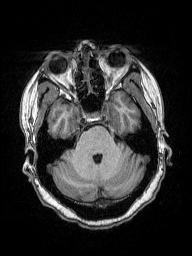
[im 73/176]
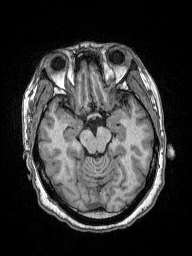
[im 103/176]
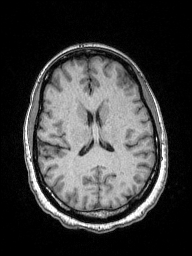
[im 117/176]
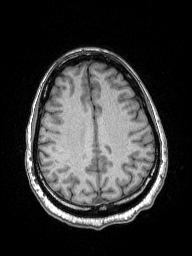
[im 146/176]
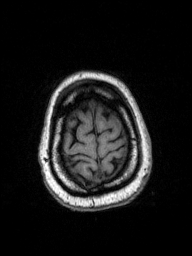
[im 176/176]
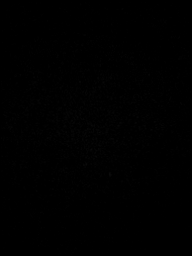

[Series 16: T2 · coronal · 5.0mm · 0.49mm/px · 2 of 27 slices shown (3 of 3)]
[im 1/27]
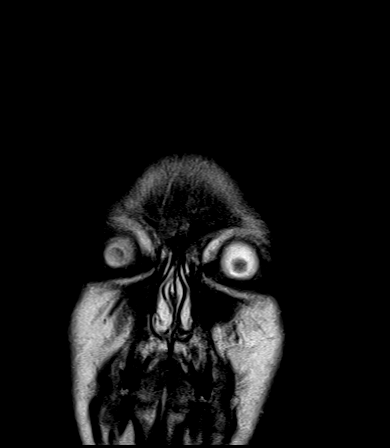
[im 27/27]
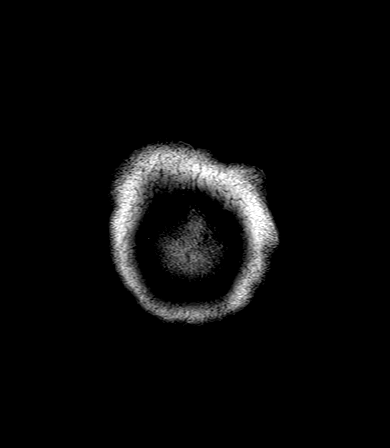

[Series 100: DWI · axial · 3.0mm · 1.80mm/px · z∈[-69,+80]mm · 4 of 51 slices shown (3 of 4)]
[im 1/51]
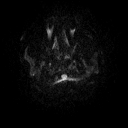
[im 17/51]
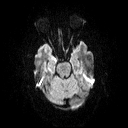
[im 34/51]
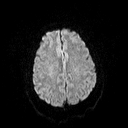
[im 51/51]
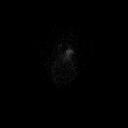

[Series 101: DWI · coronal · 3.0mm · 1.80mm/px · 3 of 43 slices shown (4 of 4)]
[im 1/43]
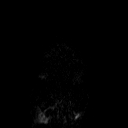
[im 22/43]
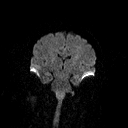
[im 43/43]
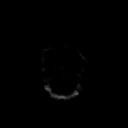

[42 of 48 positions shown; findings below may reference images not displayed]

FINDINGS: MRI HEAD FINDINGS

Brain: The midline structures are normal. No focal diffusion
restriction to indicate acute infarct. No intraparenchymal
hemorrhage. There is multifocal hyperintense T2-weighted signal
within the periventricular white matter, which may be seen in the
setting of migraine headaches or early chronic microvascular
disease; however, it is also seen in normal patients of this age. No
mass lesion. No chronic microhemorrhage or cerebral amyloid
angiopathy. No hydrocephalus, age advanced atrophy or lobar
predominant volume loss. No dural abnormality or extra-axial
collection.

Skull and upper cervical spine: The visualized skull base,
calvarium, upper cervical spine and extracranial soft tissues are
normal.

Sinuses/Orbits: No fluid levels or advanced mucosal thickening. No
mastoid effusion. Normal orbits.

MRA HEAD FINDINGS

Intracranial internal carotid arteries: Normal.

Anterior cerebral arteries: Normal.

Middle cerebral arteries: Normal.

Posterior communicating arteries: Present bilaterally.

Posterior cerebral arteries: Normal.

Basilar artery: Normal.

Vertebral arteries: Left dominant. Diminished enhancement in the V4
segment of the right vertebral artery, possibly artifactual.
Otherwise normal.

Superior cerebellar arteries: Normal.

Anterior inferior cerebellar arteries: Normal.

Posterior inferior cerebellar arteries: Normal.
IMPRESSION: 1. Nonspecific scattered foci of white matter hyperintensity. Though
this may be seen in the setting of migraine headaches or
vasculopathy such as early chronic small vessel disease, it is also
seen in normal patients of this age group.
2. No acute intracranial abnormality.
3. Normal intracranial MRA.
